# Patient Record
Sex: Male | Born: 2000 | Race: White | Hispanic: Yes | Marital: Single
Health system: Southern US, Community
[De-identification: ages and names within clinical notes are randomized; demographics above are authoritative.]

## PROBLEM LIST (undated history)

## (undated) HISTORY — PX: NO PAST SURGERIES: SHX2092

## (undated) HISTORY — PX: APPENDECTOMY: SHX54

---
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
## 2021-04-11 IMAGING — CR DG ABDOMEN 1V
1 series · 1 of 1 positions shown · non-contrast
Comparison: None.

CLINICAL DATA: Intermittent left-sided abdominal pain and
constipation since appendectomy a few months ago.

EXAM:
ABDOMEN - 1 VIEW

[abdomen kub]
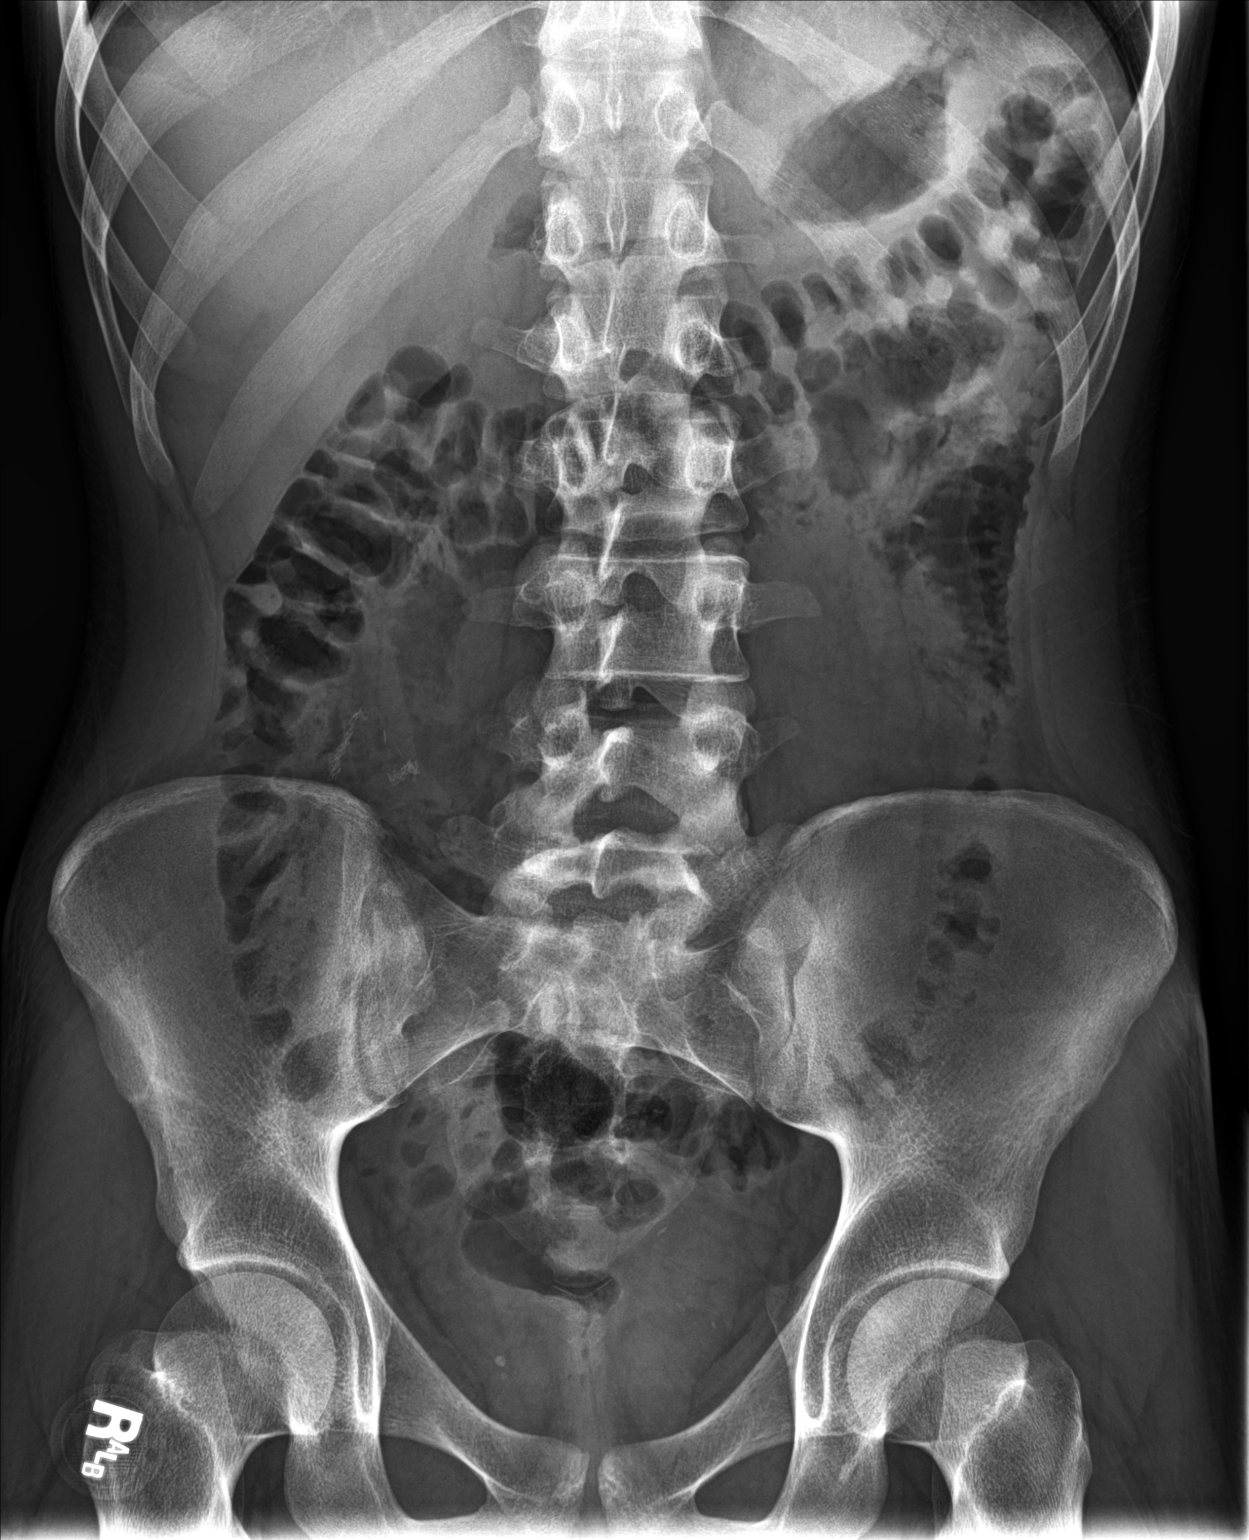

[1 of 1 positions shown; findings below may reference images not displayed]

FINDINGS: The bowel gas pattern is normal. Colonic stool burden is normal. No
radio-opaque calculi or other significant radiographic abnormality
are seen. Multiple surgical clips in the right lower quadrant
presumably related to prior appendectomy. No acute osseous
abnormality.
IMPRESSION: Negative.

## 2022-12-24 DIAGNOSIS — Z419 Encounter for procedure for purposes other than remedying health state, unspecified: Secondary | ICD-10-CM | POA: Diagnosis not present
